# Patient Record
Sex: Female | Born: 1980 | Race: White | Hispanic: No | Marital: Married | Smoking: Current every day smoker
Health system: Southern US, Community
[De-identification: ages and names within clinical notes are randomized; demographics above are authoritative.]

## PROBLEM LIST (undated history)

## (undated) DIAGNOSIS — F1910 Other psychoactive substance abuse, uncomplicated: Secondary | ICD-10-CM

---

## 2013-10-08 ENCOUNTER — Emergency Department (HOSPITAL_COMMUNITY)
Admission: EM | Admit: 2013-10-08 | Discharge: 2013-10-09 | Disposition: A | Discharge status: Home / Self Care | Payer: Medicaid - Out of State | Attending: Emergency Medicine | Admitting: Emergency Medicine

## 2013-10-08 ENCOUNTER — Encounter (HOSPITAL_COMMUNITY): Payer: Self-pay | Admitting: Emergency Medicine

## 2013-10-08 DIAGNOSIS — T401X1A Poisoning by heroin, accidental (unintentional), initial encounter: Secondary | ICD-10-CM | POA: Insufficient documentation

## 2013-10-08 DIAGNOSIS — Y929 Unspecified place or not applicable: Secondary | ICD-10-CM | POA: Insufficient documentation

## 2013-10-08 DIAGNOSIS — F1410 Cocaine abuse, uncomplicated: Secondary | ICD-10-CM | POA: Insufficient documentation

## 2013-10-08 DIAGNOSIS — R42 Dizziness and giddiness: Secondary | ICD-10-CM | POA: Insufficient documentation

## 2013-10-08 DIAGNOSIS — F1210 Cannabis abuse, uncomplicated: Secondary | ICD-10-CM | POA: Insufficient documentation

## 2013-10-08 DIAGNOSIS — F1310 Sedative, hypnotic or anxiolytic abuse, uncomplicated: Secondary | ICD-10-CM | POA: Insufficient documentation

## 2013-10-08 DIAGNOSIS — T50901A Poisoning by unspecified drugs, medicaments and biological substances, accidental (unintentional), initial encounter: Secondary | ICD-10-CM

## 2013-10-08 DIAGNOSIS — F1110 Opioid abuse, uncomplicated: Secondary | ICD-10-CM | POA: Insufficient documentation

## 2013-10-08 DIAGNOSIS — D72829 Elevated white blood cell count, unspecified: Secondary | ICD-10-CM

## 2013-10-08 DIAGNOSIS — F1910 Other psychoactive substance abuse, uncomplicated: Secondary | ICD-10-CM

## 2013-10-08 DIAGNOSIS — R110 Nausea: Secondary | ICD-10-CM | POA: Insufficient documentation

## 2013-10-08 DIAGNOSIS — F17200 Nicotine dependence, unspecified, uncomplicated: Secondary | ICD-10-CM | POA: Insufficient documentation

## 2013-10-08 DIAGNOSIS — Y939 Activity, unspecified: Secondary | ICD-10-CM | POA: Insufficient documentation

## 2013-10-08 DIAGNOSIS — Z3202 Encounter for pregnancy test, result negative: Secondary | ICD-10-CM | POA: Insufficient documentation

## 2013-10-08 DIAGNOSIS — T401X4A Poisoning by heroin, undetermined, initial encounter: Secondary | ICD-10-CM | POA: Insufficient documentation

## 2013-10-08 HISTORY — DX: Other psychoactive substance abuse, uncomplicated: F19.10

## 2013-10-08 NOTE — ED Notes (Signed)
Pt out in the hallway, loud and belligerent, asking, "where the fuck am I?". Pt informed that she needed to calm down and not swear. Pt then demanded to have something to drink. Continued to get off the bed and wonder around the hallway. Pt was escorted back to the stretcher multiple times.

## 2013-10-08 NOTE — ED Notes (Addendum)
Pt from home. Husband found pt unresponsive and not breathing. Called 911. [facility]. Administered 1mg  Narcan IN and bagged patient. EMS arrived and administered 4mg  Narcan IV and placed pt on a NRB. Pt vomited en route to the ER. NRB removed and pt able to breathe on own. Pt alert and oriented x 3. Pt's home had drug paraphernalia and cocaine on site. [employer] on site, as well.   Pt denies taking any drugs today.

## 2013-10-08 NOTE — ED Provider Notes (Signed)
CSN: [id number]     Arrival date & time 10/08/13  2048 History   First MD Initiated Contact with Patient 10/08/13 2256     Chief Complaint  Patient presents with  . Drug Overdose     (Consider location/radiation/quality/duration/timing/severity/associated sxs/prior Treatment) HPI  Patient is a 33 yo woman who is BIB EMS after a syncopal event. I have obtained hx only from the patient as there are no witnesses of the event for me to interview.   Patient says that she took two hits of a blunt then started feeling lightheaded and nauseated. She says "I puked on the side of the house and then passed out".   The patient is very upset and tearful during exam. She says that the paramedic told her she overdosed on heroin. However, the patient insists "I ain't never done no heroin a day in my life". She is without complaints at this time. She denies experiencing cp and sob. Says her po intake has been poor today.   Past Medical History  Diagnosis Date  . Polysubstance abuse    History reviewed. No pertinent past surgical history. No family history on file. History  Substance Use Topics  . Smoking status: Current Every Day Smoker  . Smokeless tobacco: Not on file  . Alcohol Use: Yes   OB History   Grav Para Term Preterm Abortions TAB SAB Ect Mult Living                 Review of Systems Ten point review of symptoms performed and is negative with the exception of symptoms noted above.     Allergies  Review of patient's allergies indicates not on file.  Home Medications  No current outpatient prescriptions on file. BP 122/76  Pulse 99  Temp(Src) 97.2 F (36.2 C) (Oral)  Resp 22  SpO2 95% Physical Exam Gen: well developed and well nourished appearing Head: NCAT Eyes: PERL, EOMI Nose: no epistaixis or rhinorrhea Mouth/throat: mucosa is moist and pink Neck: supple, no stridor, no midline tenderness Lungs: CTA B, no wheezing, rhonchi or rales CV: RRR, no murmur, extremities  appear well perfused.  Abd: soft, notender, nondistended Back: no midline ttp, no cva ttp Skin: warm and dry Ext: normal to inspection, no tenderness, no dependent edema Neuro: CN ii-xii grossly intact, no focal deficits Psyche; anxious and l affect,  calm and cooperative.   ED Course  Procedures (including critical care time) Labs Review  Results for orders placed during the hospital encounter of 10/08/13 (from the past 24 hour(s))  URINALYSIS, ROUTINE W REFLEX MICROSCOPIC     Status: Abnormal   Collection Time    10/09/13 12:32 AM      Result Value Ref Range   Color, Urine YELLOW  YELLOW   APPearance CLOUDY (*) CLEAR   Specific Gravity, Urine 1.030  1.005 - 1.030   pH 5.5  5.0 - 8.0   Glucose, UA 100 (*) NEGATIVE mg/dL   Hgb urine dipstick NEGATIVE  NEGATIVE   Bilirubin Urine NEGATIVE  NEGATIVE   Ketones, ur NEGATIVE  NEGATIVE mg/dL   Protein, ur 30 (*) NEGATIVE mg/dL   Urobilinogen, UA 0.2  0.0 - 1.0 mg/dL   Nitrite NEGATIVE  NEGATIVE   Leukocytes, UA NEGATIVE  NEGATIVE  URINE RAPID DRUG SCREEN (HOSP PERFORMED)     Status: Abnormal   Collection Time    10/09/13 12:32 AM      Result Value Ref Range   Opiates POSITIVE (*) NONE DETECTED  Cocaine POSITIVE (*) NONE DETECTED   Benzodiazepines POSITIVE (*) NONE DETECTED   Amphetamines NONE DETECTED  NONE DETECTED   Tetrahydrocannabinol POSITIVE (*) NONE DETECTED   Barbiturates NONE DETECTED  NONE DETECTED  URINE MICROSCOPIC-ADD ON     Status: Abnormal   Collection Time    10/09/13 12:32 AM      Result Value Ref Range   Squamous Epithelial / LPF FEW (*) RARE   WBC, UA 3-6  <3 WBC/hpf   RBC / HPF 0-2  <3 RBC/hpf   Bacteria, UA RARE  RARE   Casts HYALINE CASTS (*) NEGATIVE   Urine-Other MUCOUS PRESENT    CBG MONITORING, ED     Status: Abnormal   Collection Time    10/09/13 12:40 AM      Result Value Ref Range   Glucose-Capillary 105 (*) 70 - 99 mg/dL  COMPREHENSIVE METABOLIC PANEL     Status: Abnormal   Collection  Time    10/09/13 12:42 AM      Result Value Ref Range   Sodium 143  137 - 147 mEq/L   Potassium 4.3  3.7 - 5.3 mEq/L   Chloride 108  96 - 112 mEq/L   CO2 22  19 - 32 mEq/L   Glucose, Bld 113 (*) 70 - 99 mg/dL   BUN 17  6 - 23 mg/dL   Creatinine, Ser 1.61  0.50 - 1.10 mg/dL   Calcium 8.8  8.4 - 09.6 mg/dL   Total Protein 7.1  6.0 - 8.3 g/dL   Albumin 3.7  3.5 - 5.2 g/dL   AST 22  0 - 37 U/L   ALT 23  0 - 35 U/L   Alkaline Phosphatase 77  39 - 117 U/L   Total Bilirubin <0.2 (*) 0.3 - 1.2 mg/dL   GFR calc non Af Amer 74 (*) >90 mL/min   GFR calc Af Amer 86 (*) >90 mL/min  CBC WITH DIFFERENTIAL     Status: Abnormal   Collection Time    10/09/13 12:42 AM      Result Value Ref Range   WBC 17.0 (*) 4.0 - 10.5 K/uL   RBC 4.01  3.87 - 5.11 MIL/uL   Hemoglobin 12.6  12.0 - 15.0 g/dL   HCT 04.5  40.9 - 81.1 %   MCV 91.0  78.0 - 100.0 fL   MCH 31.4  26.0 - 34.0 pg   MCHC 34.5  30.0 - 36.0 g/dL   RDW 91.4  78.2 - 95.6 %   Platelets 254  150 - 400 K/uL   Neutrophils Relative % 83 (*) 43 - 77 %   Neutro Abs 14.2 (*) 1.7 - 7.7 K/uL   Lymphocytes Relative 11 (*) 12 - 46 %   Lymphs Abs 1.8  0.7 - 4.0 K/uL   Monocytes Relative 6  3 - 12 %   Monocytes Absolute 1.0  0.1 - 1.0 K/uL   Eosinophils Relative 0  0 - 5 %   Eosinophils Absolute 0.0  0.0 - 0.7 K/uL   Basophils Relative 0  0 - 1 %   Basophils Absolute 0.0  0.0 - 0.1 K/uL  ETHANOL     Status: None   Collection Time    10/09/13 12:42 AM      Result Value Ref Range   Alcohol, Ethyl (B) <11  0 - 11 mg/dL    EKG: nsr, no acute ischemic changes, normal intervals, normal axis, normal qrs complex  MDM  Patient is s/p syncope. Thought by EMS to be secondary to heroin overdose because she, reportedly, had a good response to Narcan. UDS is pending. VS and neuro exam are wnl. We are checking CBC, EKG, U/A, chem. Will monitor on telemetry in ED.   0211: The patient's UDS is positive for cocaine, opiates and THC.  I have counseled her on  the dangers of drug abuse - particularly the dangers of overdose from opiate use and cardiac disease from cocaine use. She is stable for d/c.    Brandt LoosenJulie Woodard Perrell, MD 10/09/13 [phone]

## 2013-10-09 ENCOUNTER — Emergency Department (HOSPITAL_COMMUNITY): Payer: Medicaid - Out of State

## 2013-10-09 LAB — COMPREHENSIVE METABOLIC PANEL
ALT: 23 U/L (ref 0–35)
AST: 22 U/L (ref 0–37)
Albumin: 3.7 g/dL (ref 3.5–5.2)
Alkaline Phosphatase: 77 U/L (ref 39–117)
BUN: 17 mg/dL (ref 6–23)
CHLORIDE: 108 meq/L (ref 96–112)
CO2: 22 meq/L (ref 19–32)
CREATININE: 0.99 mg/dL (ref 0.50–1.10)
Calcium: 8.8 mg/dL (ref 8.4–10.5)
GFR calc Af Amer: 86 mL/min — ABNORMAL LOW (ref >90)
GFR, EST NON AFRICAN AMERICAN: 74 mL/min — AB (ref >90)
Glucose, Bld: 113 mg/dL — ABNORMAL HIGH (ref 70–99)
Potassium: 4.3 mEq/L (ref 3.7–5.3)
Sodium: 143 mEq/L (ref 137–147)
Total Protein: 7.1 g/dL (ref 6.0–8.3)

## 2013-10-09 LAB — RAPID URINE DRUG SCREEN, HOSP PERFORMED
Amphetamines: NOT DETECTED
BARBITURATES: NOT DETECTED
Benzodiazepines: POSITIVE — AB
Cocaine: POSITIVE — AB
Opiates: POSITIVE — AB
Tetrahydrocannabinol: POSITIVE — AB

## 2013-10-09 LAB — CBC WITH DIFFERENTIAL/PLATELET
BASOS ABS: 0 10*3/uL (ref 0.0–0.1)
Basophils Relative: 0 % (ref 0–1)
EOS ABS: 0 10*3/uL (ref 0.0–0.7)
Eosinophils Relative: 0 % (ref 0–5)
HCT: 36.5 % (ref 36.0–46.0)
Hemoglobin: 12.6 g/dL (ref 12.0–15.0)
Lymphocytes Relative: 11 % — ABNORMAL LOW (ref 12–46)
Lymphs Abs: 1.8 10*3/uL (ref 0.7–4.0)
MCH: 31.4 pg (ref 26.0–34.0)
MCHC: 34.5 g/dL (ref 30.0–36.0)
MCV: 91 fL (ref 78.0–100.0)
Monocytes Absolute: 1 10*3/uL (ref 0.1–1.0)
Monocytes Relative: 6 % (ref 3–12)
NEUTROS PCT: 83 % — AB (ref 43–77)
Neutro Abs: 14.2 10*3/uL — ABNORMAL HIGH (ref 1.7–7.7)
PLATELETS: 254 10*3/uL (ref 150–400)
RBC: 4.01 MIL/uL (ref 3.87–5.11)
RDW: 13.4 % (ref 11.5–15.5)
WBC: 17 10*3/uL — AB (ref 4.0–10.5)

## 2013-10-09 LAB — URINE MICROSCOPIC-ADD ON

## 2013-10-09 LAB — URINALYSIS, ROUTINE W REFLEX MICROSCOPIC
Bilirubin Urine: NEGATIVE
Glucose, UA: 100 mg/dL — AB
Hgb urine dipstick: NEGATIVE
Ketones, ur: NEGATIVE mg/dL
LEUKOCYTES UA: NEGATIVE
NITRITE: NEGATIVE
PROTEIN: 30 mg/dL — AB
Specific Gravity, Urine: 1.03 (ref 1.005–1.030)
Urobilinogen, UA: 0.2 mg/dL (ref 0.0–1.0)
pH: 5.5 (ref 5.0–8.0)

## 2013-10-09 LAB — I-STAT TROPONIN, ED
TROPONIN I, POC: 0.01 ng/mL (ref 0.00–0.08)
Troponin i, poc: 0.02 ng/mL (ref 0.00–0.08)

## 2013-10-09 LAB — CBG MONITORING, ED: Glucose-Capillary: 105 mg/dL — ABNORMAL HIGH (ref 70–99)

## 2013-10-09 LAB — ETHANOL: Alcohol, Ethyl (B): <11 mg/dL (ref 0–11)

## 2013-10-09 NOTE — ED Notes (Signed)
Patient transported to X-ray 

## 2013-10-09 NOTE — Discharge Instructions (Signed)

## 2013-10-10 LAB — POC URINE PREG, ED: PREG TEST UR: NEGATIVE

## 2015-05-06 IMAGING — CR DG CHEST 2V
1 series · 1 of 1 positions shown · non-contrast
Comparison: None available for comparison at time of study
interpretation.

CLINICAL DATA: Loss of consciousness

EXAM:
CHEST  2 VIEW

[view not recorded]
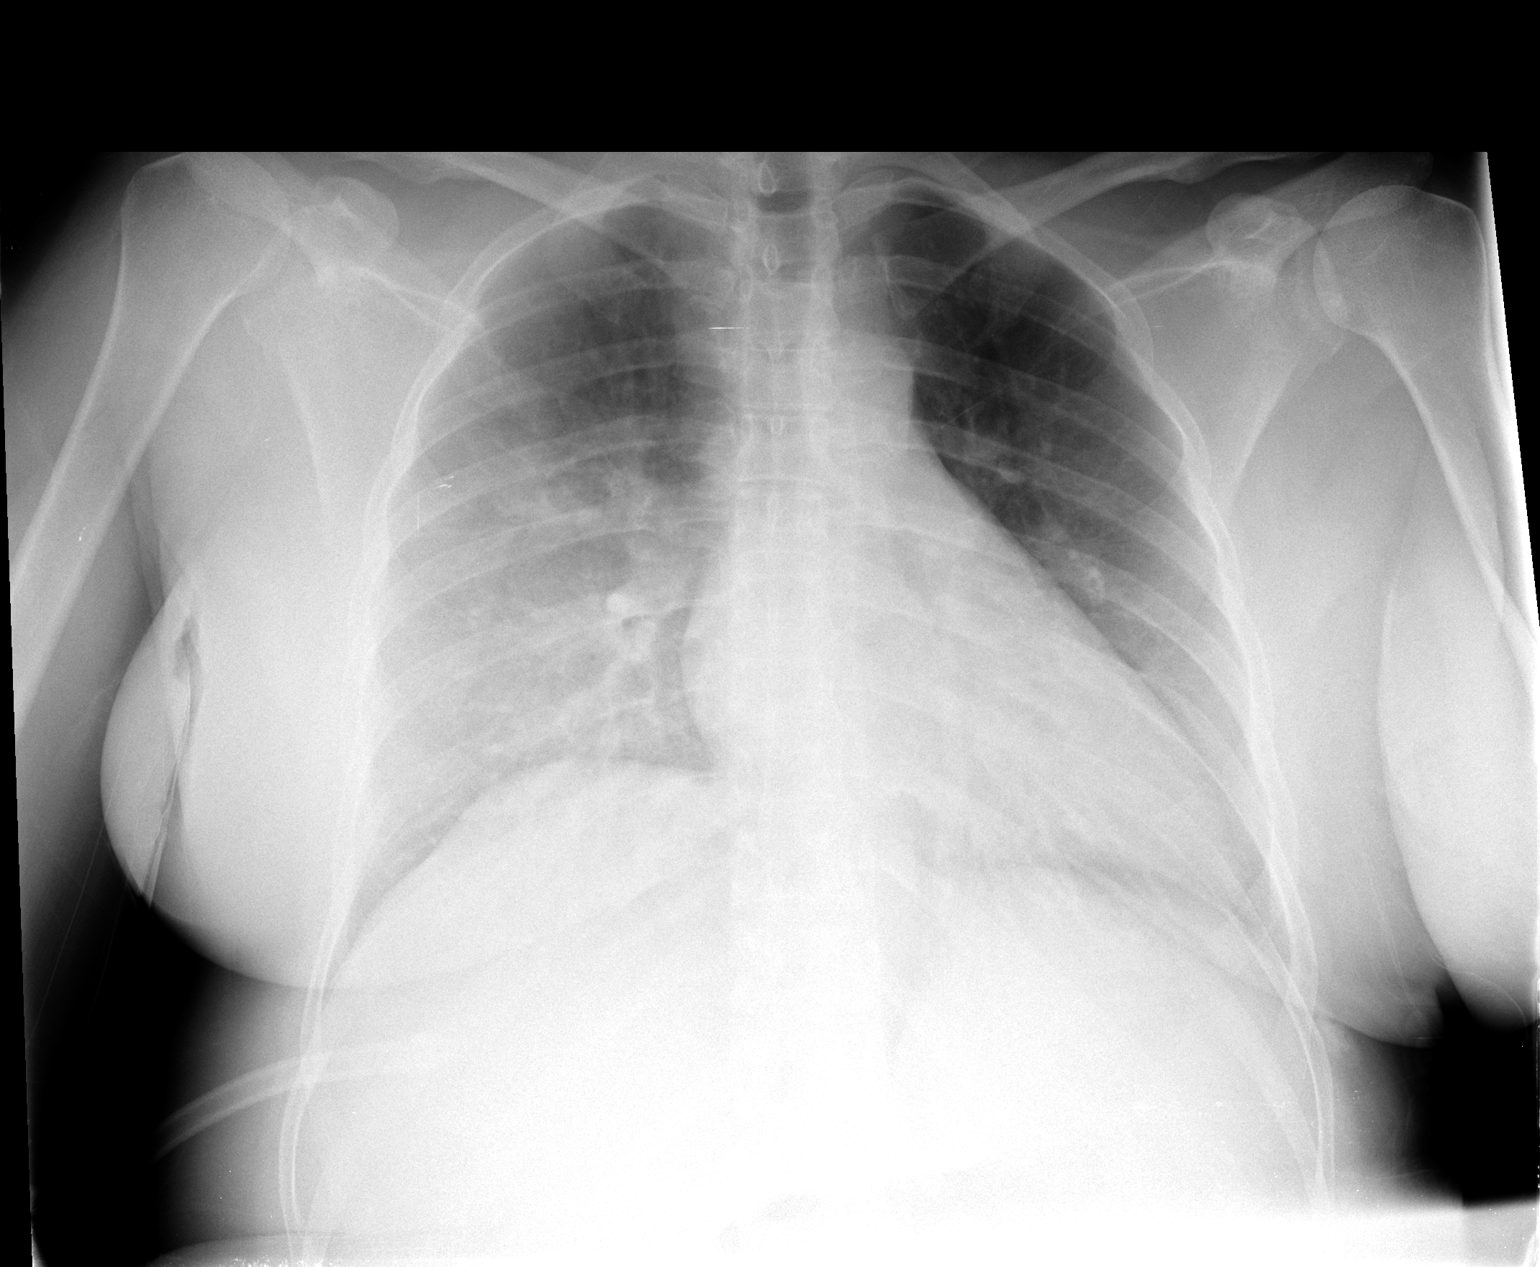

[1 of 1 positions shown; findings below may reference images not displayed]

FINDINGS: The heart size and mediastinal contours are within normal limits.
Both lungs are clear. The visualized skeletal structures are
unremarkable.
IMPRESSION: No active cardiopulmonary disease.

  By: Drey Jim
